# Patient Record
Sex: Male | Born: 1980 | Race: White | Hispanic: No | Marital: Married | Smoking: Never smoker
Health system: Southern US, Community
[De-identification: ages and names within clinical notes are randomized; demographics above are authoritative.]

## PROBLEM LIST (undated history)

## (undated) DIAGNOSIS — D696 Thrombocytopenia, unspecified: Secondary | ICD-10-CM

## (undated) DIAGNOSIS — S32040A Wedge compression fracture of fourth lumbar vertebra, initial encounter for closed fracture: Secondary | ICD-10-CM

## (undated) DIAGNOSIS — R739 Hyperglycemia, unspecified: Secondary | ICD-10-CM

## (undated) DIAGNOSIS — R55 Syncope and collapse: Secondary | ICD-10-CM

## (undated) DIAGNOSIS — S27321A Contusion of lung, unilateral, initial encounter: Secondary | ICD-10-CM

## (undated) DIAGNOSIS — S2231XA Fracture of one rib, right side, initial encounter for closed fracture: Secondary | ICD-10-CM

## (undated) DIAGNOSIS — I4891 Unspecified atrial fibrillation: Secondary | ICD-10-CM

## (undated) DIAGNOSIS — S62619A Displaced fracture of proximal phalanx of unspecified finger, initial encounter for closed fracture: Secondary | ICD-10-CM

## (undated) HISTORY — DX: Wedge compression fracture of fourth lumbar vertebra, initial encounter for closed fracture: S32.040A

## (undated) HISTORY — DX: Hyperglycemia, unspecified: R73.9

## (undated) HISTORY — DX: Thrombocytopenia, unspecified: D69.6

## (undated) HISTORY — DX: Fracture of one rib, right side, initial encounter for closed fracture: S22.31XA

## (undated) HISTORY — DX: Contusion of lung, unilateral, initial encounter: S27.321A

## (undated) HISTORY — DX: Displaced fracture of proximal phalanx of unspecified finger, initial encounter for closed fracture: S62.619A

## (undated) HISTORY — DX: Unspecified atrial fibrillation: I48.91

## (undated) HISTORY — DX: Syncope and collapse: R55

---

## 2020-02-13 ENCOUNTER — Inpatient Hospital Stay (HOSPITAL_COMMUNITY)
Admission: EM | Admit: 2020-02-13 | Discharge: 2020-02-15 | DRG: 206 | Disposition: A | Discharge status: Home Health | Payer: Managed Care, Other (non HMO) | Attending: General Surgery | Admitting: General Surgery

## 2020-02-13 ENCOUNTER — Other Ambulatory Visit: Payer: Self-pay

## 2020-02-13 ENCOUNTER — Emergency Department (HOSPITAL_COMMUNITY): Payer: Managed Care, Other (non HMO)

## 2020-02-13 ENCOUNTER — Inpatient Hospital Stay (HOSPITAL_COMMUNITY): Payer: Managed Care, Other (non HMO)

## 2020-02-13 ENCOUNTER — Encounter (HOSPITAL_COMMUNITY): Payer: Self-pay | Admitting: *Deleted

## 2020-02-13 DIAGNOSIS — S27329A Contusion of lung, unspecified, initial encounter: Secondary | ICD-10-CM

## 2020-02-13 DIAGNOSIS — S0083XA Contusion of other part of head, initial encounter: Secondary | ICD-10-CM | POA: Diagnosis present

## 2020-02-13 DIAGNOSIS — R55 Syncope and collapse: Secondary | ICD-10-CM | POA: Diagnosis not present

## 2020-02-13 DIAGNOSIS — I4891 Unspecified atrial fibrillation: Secondary | ICD-10-CM | POA: Diagnosis not present

## 2020-02-13 DIAGNOSIS — I480 Paroxysmal atrial fibrillation: Secondary | ICD-10-CM | POA: Diagnosis present

## 2020-02-13 DIAGNOSIS — M4856XA Collapsed vertebra, not elsewhere classified, lumbar region, initial encounter for fracture: Secondary | ICD-10-CM | POA: Diagnosis present

## 2020-02-13 DIAGNOSIS — D6959 Other secondary thrombocytopenia: Secondary | ICD-10-CM | POA: Diagnosis not present

## 2020-02-13 DIAGNOSIS — S2241XA Multiple fractures of ribs, right side, initial encounter for closed fracture: Secondary | ICD-10-CM | POA: Diagnosis present

## 2020-02-13 DIAGNOSIS — Z881 Allergy status to other antibiotic agents status: Secondary | ICD-10-CM

## 2020-02-13 DIAGNOSIS — Z91011 Allergy to milk products: Secondary | ICD-10-CM | POA: Diagnosis not present

## 2020-02-13 DIAGNOSIS — E785 Hyperlipidemia, unspecified: Secondary | ICD-10-CM | POA: Diagnosis present

## 2020-02-13 DIAGNOSIS — Z20822 Contact with and (suspected) exposure to covid-19: Secondary | ICD-10-CM | POA: Diagnosis present

## 2020-02-13 DIAGNOSIS — M79641 Pain in right hand: Secondary | ICD-10-CM

## 2020-02-13 DIAGNOSIS — S27321A Contusion of lung, unilateral, initial encounter: Principal | ICD-10-CM | POA: Diagnosis present

## 2020-02-13 DIAGNOSIS — S32049A Unspecified fracture of fourth lumbar vertebra, initial encounter for closed fracture: Secondary | ICD-10-CM

## 2020-02-13 DIAGNOSIS — R739 Hyperglycemia, unspecified: Secondary | ICD-10-CM

## 2020-02-13 DIAGNOSIS — R4020 Unspecified coma: Secondary | ICD-10-CM

## 2020-02-13 DIAGNOSIS — S2249XA Multiple fractures of ribs, unspecified side, initial encounter for closed fracture: Secondary | ICD-10-CM

## 2020-02-13 LAB — COMPREHENSIVE METABOLIC PANEL
ALT: 57 U/L — ABNORMAL HIGH (ref 0–44)
AST: 55 U/L — ABNORMAL HIGH (ref 15–41)
Albumin: 4.1 g/dL (ref 3.5–5.0)
Alkaline Phosphatase: 60 U/L (ref 38–126)
Anion gap: 12 (ref 5–15)
BUN: 16 mg/dL (ref 6–20)
CO2: 23 mmol/L (ref 22–32)
Calcium: 9.2 mg/dL (ref 8.9–10.3)
Chloride: 102 mmol/L (ref 98–111)
Creatinine, Ser: 1.13 mg/dL (ref 0.61–1.24)
GFR calc Af Amer: >60 mL/min (ref >60)
GFR calc non Af Amer: >60 mL/min (ref >60)
Glucose, Bld: 188 mg/dL — ABNORMAL HIGH (ref 70–99)
Potassium: 4.2 mmol/L (ref 3.5–5.1)
Sodium: 137 mmol/L (ref 135–145)
Total Bilirubin: 1.1 mg/dL (ref 0.3–1.2)
Total Protein: 6.8 g/dL (ref 6.5–8.1)

## 2020-02-13 LAB — CBC WITH DIFFERENTIAL/PLATELET
Abs Immature Granulocytes: 0.09 10*3/uL — ABNORMAL HIGH (ref 0.00–0.07)
Basophils Absolute: 0 10*3/uL (ref 0.0–0.1)
Basophils Relative: 1 %
Eosinophils Absolute: 0 10*3/uL (ref 0.0–0.5)
Eosinophils Relative: 1 %
HCT: 45.5 % (ref 39.0–52.0)
Hemoglobin: 15.3 g/dL (ref 13.0–17.0)
Immature Granulocytes: 1 %
Lymphocytes Relative: 13 %
Lymphs Abs: 1.1 10*3/uL (ref 0.7–4.0)
MCH: 29.3 pg (ref 26.0–34.0)
MCHC: 33.6 g/dL (ref 30.0–36.0)
MCV: 87 fL (ref 80.0–100.0)
Monocytes Absolute: 0.5 10*3/uL (ref 0.1–1.0)
Monocytes Relative: 5 %
Neutro Abs: 6.8 10*3/uL (ref 1.7–7.7)
Neutrophils Relative %: 79 %
Platelets: 188 10*3/uL (ref 150–400)
RBC: 5.23 MIL/uL (ref 4.22–5.81)
RDW: 12 % (ref 11.5–15.5)
WBC: 8.6 10*3/uL (ref 4.0–10.5)
nRBC: 0 % (ref 0.0–0.2)

## 2020-02-13 LAB — HEMOGLOBIN A1C
Hgb A1c MFr Bld: 5.4 % (ref 4.8–5.6)
Mean Plasma Glucose: 108.28 mg/dL

## 2020-02-13 LAB — TROPONIN I (HIGH SENSITIVITY)
Troponin I (High Sensitivity): 6 ng/L (ref <18)
Troponin I (High Sensitivity): 6 ng/L (ref <18)

## 2020-02-13 LAB — LACTIC ACID, PLASMA
Lactic Acid, Venous: 3.2 mmol/L (ref 0.5–1.9)
Lactic Acid, Venous: 1 mmol/L (ref 0.5–1.9)

## 2020-02-13 LAB — TSH: TSH: 0.924 u[IU]/mL (ref 0.350–4.500)

## 2020-02-13 LAB — HIV ANTIBODY (ROUTINE TESTING W REFLEX): HIV Screen 4th Generation wRfx: NONREACTIVE

## 2020-02-13 LAB — CBG MONITORING, ED: Glucose-Capillary: 187 mg/dL — ABNORMAL HIGH (ref 70–99)

## 2020-02-13 LAB — SARS CORONAVIRUS 2 BY RT PCR (HOSPITAL ORDER, PERFORMED IN ~~LOC~~ HOSPITAL LAB): SARS Coronavirus 2: NEGATIVE

## 2020-02-13 MED ORDER — SODIUM CHLORIDE 0.9 % IV BOLUS
1000.0000 mL | Freq: Once | INTRAVENOUS | Status: AC
  Administered 2020-02-13: 1000 mL via INTRAVENOUS

## 2020-02-13 MED ORDER — ONDANSETRON HCL 4 MG/2ML IJ SOLN: 4.0000 mg | Freq: Four times a day (QID) | INTRAMUSCULAR | Status: DC | PRN

## 2020-02-13 MED ORDER — IBUPROFEN 200 MG PO TABS
600.0000 mg | ORAL_TABLET | Freq: Four times a day (QID) | ORAL | Status: DC | PRN
  Administered 2020-02-14 – 2020-02-15 (×2): 600 mg via ORAL
  Filled 2020-02-13 (×2): qty 3

## 2020-02-13 MED ORDER — ACETAMINOPHEN 325 MG PO TABS
650.0000 mg | ORAL_TABLET | Freq: Four times a day (QID) | ORAL | Status: DC
  Administered 2020-02-13 – 2020-02-15 (×7): 650 mg via ORAL
  Filled 2020-02-13 (×7): qty 2

## 2020-02-13 MED ORDER — LACTATED RINGERS IV SOLN: INTRAVENOUS | Status: DC

## 2020-02-13 MED ORDER — ONDANSETRON 4 MG PO TBDP: 4.0000 mg | ORAL_TABLET | Freq: Four times a day (QID) | ORAL | Status: DC | PRN

## 2020-02-13 MED ORDER — IOHEXOL 300 MG/ML  SOLN
100.0000 mL | Freq: Once | INTRAMUSCULAR | Status: AC | PRN
  Administered 2020-02-13: 100 mL via INTRAVENOUS

## 2020-02-13 MED ORDER — HYDROMORPHONE HCL 1 MG/ML IJ SOLN: 0.5000 mg | INTRAMUSCULAR | Status: DC | PRN

## 2020-02-13 MED ORDER — ENOXAPARIN SODIUM 40 MG/0.4ML ~~LOC~~ SOLN
40.0000 mg | SUBCUTANEOUS | Status: DC
  Filled 2020-02-13 (×2): qty 0.4

## 2020-02-13 MED ORDER — OXYCODONE HCL 5 MG PO TABS
5.0000 mg | ORAL_TABLET | Freq: Four times a day (QID) | ORAL | Status: DC | PRN
  Administered 2020-02-14: 5 mg via ORAL
  Filled 2020-02-13: qty 2
  Filled 2020-02-13: qty 1

## 2020-02-13 MED ORDER — DOCUSATE SODIUM 100 MG PO CAPS
200.0000 mg | ORAL_CAPSULE | Freq: Two times a day (BID) | ORAL | Status: DC
  Administered 2020-02-13 – 2020-02-15 (×4): 200 mg via ORAL
  Filled 2020-02-13 (×5): qty 2

## 2020-02-13 MED ORDER — FENTANYL CITRATE (PF) 100 MCG/2ML IJ SOLN
50.0000 ug | Freq: Once | INTRAMUSCULAR | Status: AC
  Administered 2020-02-13: 50 ug via INTRAVENOUS
  Filled 2020-02-13: qty 2

## 2020-02-13 MED ORDER — DIPHENHYDRAMINE HCL 25 MG PO CAPS: 25.0000 mg | ORAL_CAPSULE | Freq: Four times a day (QID) | ORAL | Status: DC | PRN

## 2020-02-13 NOTE — Progress Notes (Signed)
Orthopedic Tech Progress Note Patient Details:  Barry Ware 10-18-1980 [id number]  Ortho Devices Type of Ortho Device: Finger splint Ortho Device/Splint Location: Right Index Finger Ortho Device/Splint Interventions: Application   Post Interventions Patient Tolerated: Well   Barry Ware [name] 02/13/2020, 10:11 PM

## 2020-02-13 NOTE — Progress Notes (Signed)
Orthopedic Tech Progress Note Patient Details:  Barry Ware Oct 10, 1980 [id number] Called in brace Patient ID: Barry Ware, male   DOB: [birth year], 39 y.o.   MRN: [id number]   [name] 02/13/2020, 8:29 PM
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
## 2020-02-13 NOTE — ED Notes (Signed)
Patient transported to X-ray 
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
## 2020-02-14 ENCOUNTER — Inpatient Hospital Stay (HOSPITAL_COMMUNITY): Payer: Managed Care, Other (non HMO)

## 2020-02-14 DIAGNOSIS — S2249XA Multiple fractures of ribs, unspecified side, initial encounter for closed fracture: Secondary | ICD-10-CM

## 2020-02-14 DIAGNOSIS — I4891 Unspecified atrial fibrillation: Secondary | ICD-10-CM

## 2020-02-14 DIAGNOSIS — R55 Syncope and collapse: Secondary | ICD-10-CM

## 2020-02-14 DIAGNOSIS — R739 Hyperglycemia, unspecified: Secondary | ICD-10-CM

## 2020-02-14 DIAGNOSIS — R4020 Unspecified coma: Secondary | ICD-10-CM

## 2020-02-14 LAB — GLUCOSE, CAPILLARY
Glucose-Capillary: 157 mg/dL — ABNORMAL HIGH (ref 70–99)
Glucose-Capillary: 110 mg/dL — ABNORMAL HIGH (ref 70–99)
Glucose-Capillary: 110 mg/dL — ABNORMAL HIGH (ref 70–99)
Glucose-Capillary: 101 mg/dL — ABNORMAL HIGH (ref 70–99)

## 2020-02-14 LAB — RAPID URINE DRUG SCREEN, HOSP PERFORMED
Amphetamines: NOT DETECTED
Barbiturates: NOT DETECTED
Benzodiazepines: NOT DETECTED
Cocaine: NOT DETECTED
Opiates: NOT DETECTED
Tetrahydrocannabinol: NOT DETECTED

## 2020-02-14 LAB — COMPREHENSIVE METABOLIC PANEL
ALT: 44 U/L (ref 0–44)
AST: 36 U/L (ref 15–41)
Albumin: 3.5 g/dL (ref 3.5–5.0)
Alkaline Phosphatase: 48 U/L (ref 38–126)
Anion gap: 10 (ref 5–15)
BUN: 13 mg/dL (ref 6–20)
CO2: 22 mmol/L (ref 22–32)
Calcium: 8.7 mg/dL — ABNORMAL LOW (ref 8.9–10.3)
Chloride: 107 mmol/L (ref 98–111)
Creatinine, Ser: 0.87 mg/dL (ref 0.61–1.24)
GFR calc Af Amer: >60 mL/min (ref >60)
GFR calc non Af Amer: >60 mL/min (ref >60)
Glucose, Bld: 109 mg/dL — ABNORMAL HIGH (ref 70–99)
Potassium: 3.8 mmol/L (ref 3.5–5.1)
Sodium: 139 mmol/L (ref 135–145)
Total Bilirubin: 1.2 mg/dL (ref 0.3–1.2)
Total Protein: 6.1 g/dL — ABNORMAL LOW (ref 6.5–8.1)

## 2020-02-14 LAB — CBC
HCT: 40.7 % (ref 39.0–52.0)
Hemoglobin: 13.3 g/dL (ref 13.0–17.0)
MCH: 28.8 pg (ref 26.0–34.0)
MCHC: 32.7 g/dL (ref 30.0–36.0)
MCV: 88.1 fL (ref 80.0–100.0)
Platelets: 142 10*3/uL — ABNORMAL LOW (ref 150–400)
RBC: 4.62 MIL/uL (ref 4.22–5.81)
RDW: 12.3 % (ref 11.5–15.5)
WBC: 6.1 10*3/uL (ref 4.0–10.5)
nRBC: 0 % (ref 0.0–0.2)

## 2020-02-14 LAB — URINALYSIS, ROUTINE W REFLEX MICROSCOPIC
Bilirubin Urine: NEGATIVE
Glucose, UA: 50 mg/dL — AB
Hgb urine dipstick: NEGATIVE
Ketones, ur: 5 mg/dL — AB
Leukocytes,Ua: NEGATIVE
Nitrite: NEGATIVE
Protein, ur: NEGATIVE mg/dL
Specific Gravity, Urine: 1.036 — ABNORMAL HIGH (ref 1.005–1.030)
pH: 7 (ref 5.0–8.0)

## 2020-02-14 LAB — ECHOCARDIOGRAM COMPLETE
Area-P 1/2: 2.76 cm2
Height: 73 in
S' Lateral: 3.29 cm
Weight: 2640 oz

## 2020-02-14 LAB — MRSA PCR SCREENING: MRSA by PCR: NEGATIVE

## 2020-02-14 MED ORDER — POLYETHYLENE GLYCOL 3350 17 G PO PACK: 17.0000 g | PACK | Freq: Every day | ORAL | Status: DC | PRN

## 2020-02-14 MED ORDER — METHOCARBAMOL 500 MG PO TABS
500.0000 mg | ORAL_TABLET | Freq: Three times a day (TID) | ORAL | Status: DC
  Administered 2020-02-14 – 2020-02-15 (×4): 500 mg via ORAL
  Filled 2020-02-14 (×4): qty 1

## 2020-02-14 MED ORDER — INSULIN ASPART 100 UNIT/ML ~~LOC~~ SOLN: 0.0000 [IU] | Freq: Every day | SUBCUTANEOUS | Status: DC

## 2020-02-14 MED ORDER — INSULIN ASPART 100 UNIT/ML ~~LOC~~ SOLN: 0.0000 [IU] | Freq: Three times a day (TID) | SUBCUTANEOUS | Status: DC

## 2020-02-14 NOTE — Progress Notes (Signed)
Orthopedic Tech Progress Note Patient Details:  Onyx Schirmer [birth year] [id number]  Ortho Devices Type of Ortho Device: Volar splint Ortho Device/Splint Location: URE Ortho Device/Splint Interventions: Application, Ordered   Post Interventions Patient Tolerated: Well Instructions Provided: Care of device   Tashiana Lamarca A Tyra Gural 02/14/2020, 1:05 PM

## 2020-02-14 NOTE — ED Provider Notes (Signed)
Ultrasound ED FAST  Date/Time: 02/14/2020 12:17 AM Performed by: Milagros Loll, MD Authorized by: Milagros Loll, MD  Procedure details:    Indications: blunt abdominal trauma and blunt chest trauma      Assess for:  Hemothorax, intra-abdominal fluid, pericardial effusion and pneumothorax    Technique:  Abdominal, cardiac and chest    Images: archived      Abdominal findings:    L kidney:  Visualized   R kidney:  Visualized   Liver:  Visualized    Bladder:  Visualized, Foley catheter not visualized   Hepatorenal space visualized: identified     Splenorenal space: identified     Rectovesical free fluid: not identified     Splenorenal free fluid: not identified     Hepatorenal space free fluid: not identified   Cardiac findings:    Heart:  Visualized   Wall motion: identified     Pericardial effusion: not identified   Chest findings:    L lung sliding: identified     R lung sliding: identified     Fluid in thorax: not identified        Milagros Loll, MD 02/14/20 [phone]
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
## 2020-02-14 NOTE — Progress Notes (Signed)
  Echocardiogram 2D Echocardiogram has been performed.  Barry Ware 02/14/2020, 11:10 AM
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
## 2020-02-14 NOTE — Progress Notes (Signed)
OT Cancellation Note  Patient Details Name: Barry Ware MRN: [id number] DOB: 05/27/1981   Cancelled Treatment:    Reason Eval/Treat Not Completed: Patient at procedure or test/ unavailable (Echo). OT acknowledges imminent discharge order, and will check back as available.   Evern Bio Tekela Garguilo 02/14/2020, 10:46 AM   Nyoka Cowden OTR/L Acute Rehabilitation Services Pager: [phone] Office: [phone]
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
## 2020-02-15 ENCOUNTER — Other Ambulatory Visit: Payer: Self-pay | Admitting: Medical

## 2020-02-15 DIAGNOSIS — R55 Syncope and collapse: Secondary | ICD-10-CM

## 2020-02-15 LAB — GLUCOSE, CAPILLARY
Glucose-Capillary: 118 mg/dL — ABNORMAL HIGH (ref 70–99)
Glucose-Capillary: 171 mg/dL — ABNORMAL HIGH (ref 70–99)
Glucose-Capillary: 101 mg/dL — ABNORMAL HIGH (ref 70–99)

## 2020-02-15 MED ORDER — DOCUSATE SODIUM 100 MG PO CAPS: 200.0000 mg | ORAL_CAPSULE | Freq: Two times a day (BID) | ORAL | 0 refills | Status: DC

## 2020-02-15 MED ORDER — METHOCARBAMOL 500 MG PO TABS
500.0000 mg | ORAL_TABLET | Freq: Three times a day (TID) | ORAL | 0 refills | Status: DC
Start: 2020-02-15 — End: 2020-05-22

## 2020-02-15 MED ORDER — IBUPROFEN 600 MG PO TABS
600.0000 mg | ORAL_TABLET | Freq: Four times a day (QID) | ORAL | 0 refills | Status: DC | PRN
Start: 2020-02-15 — End: 2020-05-22

## 2020-02-15 MED ORDER — ACETAMINOPHEN 325 MG PO TABS
650.0000 mg | ORAL_TABLET | Freq: Four times a day (QID) | ORAL | 0 refills | Status: DC
Start: 2020-02-15 — End: 2020-05-22

## 2020-02-15 MED ORDER — OXYCODONE HCL 5 MG PO TABS: 5.0000 mg | ORAL_TABLET | Freq: Four times a day (QID) | ORAL | 0 refills | Status: DC | PRN
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
## 2020-02-15 NOTE — Progress Notes (Signed)
Pt discharged home with 3n1 and outpatient therapy consults. PIV removed, belongings packed, and discharge paperwork educated on with no questions asked.  Robina Ade, RN
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
## 2020-02-15 NOTE — TOC CAGE-AID Note (Signed)
Transition of Care [facility]) - CAGE-AID Screening   Patient Details  Name: Barry Ware MRN: [id number] Date of Birth: June 28, 1980  [facility]) CM/SW Contact:    Emeterio Reeve, [place] Phone Number: 02/15/2020, 4:00 PM   Clinical Narrative:  CSW met with pt at bedside. CSW introduced self and explained her role at the hospital.  PT denies alcohol use and substance use. Pt did not need any resources at this time.    CAGE-AID Screening:    Have You Ever Felt You Ought to Cut Down on Your Drinking or Drug Use?: No Have People Annoyed You By Critizing Your Drinking Or Drug Use?: No Have You Felt Bad Or Guilty About Your Drinking Or Drug Use?: No Have You Ever Had a Drink or Used Drugs First Thing In The Morning to Steady Your Nerves or to Get Rid of a Hangover?: No CAGE-AID Score: 0  Substance Abuse Education Offered: Yes    Blima Ledger, [place] Social Worker [phone]
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
## 2020-02-26 ENCOUNTER — Other Ambulatory Visit: Payer: Self-pay

## 2020-02-26 ENCOUNTER — Encounter: Payer: Self-pay | Admitting: Cardiology

## 2020-02-26 ENCOUNTER — Ambulatory Visit (INDEPENDENT_AMBULATORY_CARE_PROVIDER_SITE_OTHER): Payer: Managed Care, Other (non HMO) | Admitting: Cardiology

## 2020-02-26 VITALS — BP 120/76 | HR 82 | Ht 73.0 in | Wt 173.8 lb

## 2020-02-26 DIAGNOSIS — I4891 Unspecified atrial fibrillation: Secondary | ICD-10-CM

## 2020-02-26 DIAGNOSIS — R55 Syncope and collapse: Secondary | ICD-10-CM | POA: Diagnosis not present

## 2020-02-26 NOTE — Patient Instructions (Addendum)
Medication Instructions:  Your physician recommends that you continue on your current medications as directed. Please refer to the Current Medication list given to you today.  Labwork: None ordered.  Testing/Procedures: None ordered.  Follow-Up: Your physician wants you to follow-up in: 3 months with Dr. Lalla Brothers.     May 29, 2020 at 9:30 am at the [facility] office   Any Other Special Instructions Will Be Listed Below (If Applicable).  If you need a refill on your cardiac medications before your next appointment, please call your pharmacy.

## 2020-02-28 ENCOUNTER — Ambulatory Visit (INDEPENDENT_AMBULATORY_CARE_PROVIDER_SITE_OTHER): Payer: Managed Care, Other (non HMO)

## 2020-02-28 DIAGNOSIS — R55 Syncope and collapse: Secondary | ICD-10-CM | POA: Diagnosis not present

## 2020-03-03 ENCOUNTER — Ambulatory Visit: Payer: Managed Care, Other (non HMO)

## 2020-03-04 ENCOUNTER — Telehealth: Payer: Self-pay | Admitting: Cardiology

## 2020-03-04 NOTE — Telephone Encounter (Signed)
New message:    Patient calling to see if it is ok for him to drive. Please call patient.

## 2020-03-04 NOTE — Telephone Encounter (Signed)
Sent mychart message advising Pt could drive.

## 2020-03-11 ENCOUNTER — Other Ambulatory Visit: Payer: Self-pay

## 2020-03-11 ENCOUNTER — Ambulatory Visit: Payer: Managed Care, Other (non HMO) | Attending: General Surgery

## 2020-03-11 DIAGNOSIS — M542 Cervicalgia: Secondary | ICD-10-CM | POA: Diagnosis not present

## 2020-03-11 DIAGNOSIS — M545 Low back pain, unspecified: Secondary | ICD-10-CM

## 2020-03-11 DIAGNOSIS — M6281 Muscle weakness (generalized): Secondary | ICD-10-CM | POA: Diagnosis not present

## 2020-03-11 DIAGNOSIS — S2241XD Multiple fractures of ribs, right side, subsequent encounter for fracture with routine healing: Secondary | ICD-10-CM | POA: Insufficient documentation

## 2020-03-11 DIAGNOSIS — M25641 Stiffness of right hand, not elsewhere classified: Secondary | ICD-10-CM | POA: Diagnosis not present

## 2020-03-11 DIAGNOSIS — R0781 Pleurodynia: Secondary | ICD-10-CM | POA: Insufficient documentation

## 2020-03-11 DIAGNOSIS — R278 Other lack of coordination: Secondary | ICD-10-CM | POA: Diagnosis not present

## 2020-03-11 DIAGNOSIS — M79641 Pain in right hand: Secondary | ICD-10-CM | POA: Diagnosis not present

## 2020-03-11 NOTE — Addendum Note (Signed)
Addended by: Ileana Ladd on: 03/11/2020 01:12 PM   Modules accepted: Orders

## 2020-03-11 NOTE — Addendum Note (Signed)
Addended by: Ileana Ladd on: 03/11/2020 01:04 PM   Modules accepted: Orders
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
## 2020-03-20 ENCOUNTER — Ambulatory Visit: Payer: Managed Care, Other (non HMO) | Admitting: Occupational Therapy

## 2020-03-20 ENCOUNTER — Encounter: Payer: Self-pay | Admitting: Occupational Therapy

## 2020-03-20 ENCOUNTER — Other Ambulatory Visit: Payer: Self-pay

## 2020-03-20 DIAGNOSIS — M79641 Pain in right hand: Secondary | ICD-10-CM

## 2020-03-20 DIAGNOSIS — R278 Other lack of coordination: Secondary | ICD-10-CM

## 2020-03-20 DIAGNOSIS — M25641 Stiffness of right hand, not elsewhere classified: Secondary | ICD-10-CM

## 2020-03-20 DIAGNOSIS — M6281 Muscle weakness (generalized): Secondary | ICD-10-CM

## 2020-03-20 DIAGNOSIS — S2241XD Multiple fractures of ribs, right side, subsequent encounter for fracture with routine healing: Secondary | ICD-10-CM | POA: Diagnosis not present
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
## 2020-03-20 NOTE — Patient Instructions (Signed)
1. Grip Strengthening (Resistive Putty)   Squeeze putty using thumb and all fingers. Repeat _20___ times. Do __2__ sessions per day.   2. Roll putty into tube on table and pinch between each finger and thumb x 10 reps each. (can do ring and small finger together)     Copyright  VHI. All rights reserved.    Coordination Activities  Perform the following activities for 20 minutes 1 times per day with right hand(s).   Rotate ball in fingertips (clockwise and counter-clockwise).  Flip cards 1 at a time as fast as you can.  Deal cards with your thumb (Hold deck in hand and push card off top with thumb).  Shuffle cards.  Pick up coins and place in container or coin bank.  Pick up coins and stack.  Pick up coins one at a time until you get 5-10 in your hand, then move coins from palm to fingertips to stack one at a time.

## 2020-03-26 ENCOUNTER — Other Ambulatory Visit: Payer: Self-pay

## 2020-03-26 ENCOUNTER — Ambulatory Visit: Payer: Managed Care, Other (non HMO) | Attending: General Surgery | Admitting: Physical Therapy

## 2020-03-26 DIAGNOSIS — M542 Cervicalgia: Secondary | ICD-10-CM

## 2020-03-26 DIAGNOSIS — S2241XA Multiple fractures of ribs, right side, initial encounter for closed fracture: Secondary | ICD-10-CM | POA: Diagnosis present

## 2020-03-26 DIAGNOSIS — X58XXXA Exposure to other specified factors, initial encounter: Secondary | ICD-10-CM | POA: Insufficient documentation

## 2020-03-26 DIAGNOSIS — R0781 Pleurodynia: Secondary | ICD-10-CM

## 2020-03-26 DIAGNOSIS — M545 Low back pain, unspecified: Secondary | ICD-10-CM
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
## 2020-03-26 NOTE — Patient Instructions (Addendum)
  Home exercise program created by Donna Silverman, PT.  For questions, please contact Tyleah Loh via phone at [phone] or email at [email or web address]  [facility] [place] [address]  [address] [place], [place], [address] Phone: [phone]   Fax:  [phone]     Sleeping on Back  Place pillow under knees. A pillow with cervical support and a roll around waist are also helpful. Copyright  VHI. All rights reserved.  Sleeping on Side Place pillow between knees. Use cervical support under neck and a roll around waist as needed. Copyright  VHI. All rights reserved.   Sleeping on Stomach   If this is the only desirable sleeping position, place pillow under lower legs, and under stomach or chest as needed.  Posture - Sitting   Sit upright, head facing forward. Try using a roll to support lower back. Keep shoulders relaxed, and avoid rounded back. Keep hips level with knees. Avoid crossing legs for long periods. Stand to Sit / Sit to Stand   To sit: Bend knees to lower self onto front edge of chair, then scoot back on seat. To stand: Reverse sequence by placing one foot forward, and scoot to front of seat. Use rocking motion to stand up.   Work Height and Reach  Ideal work height is no more than 2 to 4 inches below elbow level when standing, and at elbow level when sitting. Reaching should be limited to arm's length, with elbows slightly bent.  Bending  Bend at hips and knees, not back. Keep feet shoulder-width apart.    Posture - Standing   Good posture is important. Avoid slouching and forward head thrust. Maintain curve in low back and align ears over shoul- ders, hips over ankles.  Alternating Positions   Alternate tasks and change positions frequently to reduce fatigue and muscle tension. Take rest breaks. Computer Work   Position work to face forward. Use proper work and seat height. Keep shoulders back  and down, wrists straight, and elbows at right angles. Use chair that provides full back support. Add footrest and lumbar roll as needed.  Getting Into / Out of Car  Lower self onto seat, scoot back, then bring in one leg at a time. Reverse sequence to get out.  Dressing  Lie on back to pull socks or slacks over feet, or sit and bend leg while keeping back straight.    Housework - Sink  Place one foot on ledge of cabinet under sink when standing at sink for prolonged periods.   Pushing / Pulling  Pushing is preferable to pulling. Keep back in proper alignment, and use leg muscles to do the work.  Deep Squat   Squat and lift with both arms held against upper trunk. Tighten stomach muscles without holding breath. Use smooth movements to avoid jerking.  Avoid Twisting   Avoid twisting or bending back. Pivot around using foot movements, and bend at knees if needed when reaching for articles.  Carrying Luggage   Distribute weight evenly on both sides. Use a cart whenever possible. Do not twist trunk. Move body as a unit.   Lifting Principles .Maintain proper posture and head alignment. .Slide object as close as possible before lifting. .Move obstacles out of the way. .Test before lifting; ask for help if too heavy. .Tighten stomach muscles without holding breath. .Use smooth movements; do not jerk. .Use legs to do the work, and pivot with feet. .Distribute the work load symmetrically and   close to the center of trunk. .Push instead of pull whenever possible.   Ask For Help   Ask for help and delegate to others when possible. Coordinate your movements when lifting together, and maintain the low back curve.  Log Roll   Lying on back, bend left knee and place left arm across chest. Roll all in one movement to the right. Reverse to roll to the left. Always move as one unit. Housework - Sweeping  Use long-handled equipment to avoid stooping.   Housework -  Wiping  Position yourself as close as possible to reach work surface. Avoid straining your back.  Laundry - Unloading Wash   To unload small items at bottom of washer, lift leg opposite to arm being used to reach.  Gardening - Raking  Move close to area to be raked. Use arm movements to do the work. Keep back straight and avoid twisting.     Cart  When reaching into cart with one arm, lift opposite leg to keep back straight.   Getting Into / Out of Bed  Lower self to lie down on one side by raising legs and lowering head at the same time. Use arms to assist moving without twisting. Bend both knees to roll onto back if desired. To sit up, start from lying on side, and use same move-ments in reverse. Housework - Vacuuming  Hold the vacuum with arm held at side. Step back and forth to move it, keeping head up. Avoid twisting.   Laundry - Loading Wash  Position laundry basket so that bending and twisting can be avoided.   Laundry - Unloading Dryer  Squat down to reach into clothes dryer or use a reacher.  Gardening - Weeding / Planting  Squat or Kneel. Knee pads may be helpful.                    

## 2020-03-28 ENCOUNTER — Ambulatory Visit: Payer: Managed Care, Other (non HMO) | Admitting: Occupational Therapy

## 2020-03-28 ENCOUNTER — Encounter: Payer: Self-pay | Admitting: Occupational Therapy

## 2020-03-28 ENCOUNTER — Other Ambulatory Visit: Payer: Self-pay

## 2020-03-28 DIAGNOSIS — S2241XA Multiple fractures of ribs, right side, initial encounter for closed fracture: Secondary | ICD-10-CM | POA: Diagnosis not present

## 2020-03-28 DIAGNOSIS — M6281 Muscle weakness (generalized): Secondary | ICD-10-CM

## 2020-03-28 DIAGNOSIS — M79641 Pain in right hand: Secondary | ICD-10-CM

## 2020-03-28 DIAGNOSIS — R278 Other lack of coordination: Secondary | ICD-10-CM

## 2020-03-28 DIAGNOSIS — M25641 Stiffness of right hand, not elsewhere classified: Secondary | ICD-10-CM
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
## 2020-03-28 NOTE — Patient Instructions (Addendum)
MP Flexion (Active Isolated)   Bend __index_ finger at large knuckle, keeping other fingers straight. Do not bend tips. Repeat _10-15___ times. Do __4-6__ sessions per day.  AROM: PIP Flexion / Extension   Pinch bottom knuckle of __index__ finger of hand to prevent bending. Actively bend middle knuckle until stretch is felt. Hold __5__ seconds. Relax. Straighten finger as far as possible. Repeat __10-15__ times per set. Do _4-6___ sessions per day.   AROM: DIP Flexion / Extension   Pinch middle knuckle of _index__ finger of  hand to prevent bending. Bend end knuckle until stretch is felt. Hold _5___ seconds. Relax. Straighten finger as far as possible. Repeat _10-15___ times per set.  Do _4-6___ sessions per day.  AROM: Finger Flexion / Extension   Actively bend fingers of  hand. Start with knuckles furthest from palm, and slowly make a fist. Hold __5__ seconds. Relax. Then straighten fingers as far as possible. Repeat _10-15___ times per set.  Do _4-6___ sessions per day.  Copyright  VHI. All rights reserved.      Flexor Tendon Gliding (Active Hook Fist)   With fingers and knuckles straight, bend middle and tip joints. Do not bend large knuckles. Repeat _10-15___ times. Do _4-6___ sessions per day.  MP Flexion (Active)   With back of hand on table, bend large knuckles as far as they will go, keeping small joints straight. Repeat _10-15___ times. Do __4-6__ sessions per day. Activity: Reach into a narrow container.*      Finger Flexion / Extension   With palm up, bend fingers of left hand toward palm, making a  fist. Straighten fingers, opening fist. Repeat sequence _10-15___ times per session. Do _4-6__ sessions per day. Hand Variation: Palm down   Copyright  VHI. All rights reserved.

## 2020-04-02 ENCOUNTER — Other Ambulatory Visit: Payer: Self-pay

## 2020-04-02 ENCOUNTER — Ambulatory Visit: Payer: Managed Care, Other (non HMO)

## 2020-04-02 DIAGNOSIS — M542 Cervicalgia: Secondary | ICD-10-CM

## 2020-04-02 DIAGNOSIS — S2241XA Multiple fractures of ribs, right side, initial encounter for closed fracture: Secondary | ICD-10-CM | POA: Diagnosis not present

## 2020-04-02 DIAGNOSIS — M545 Low back pain, unspecified: Secondary | ICD-10-CM

## 2020-04-02 DIAGNOSIS — R278 Other lack of coordination: Secondary | ICD-10-CM

## 2020-04-02 DIAGNOSIS — R0781 Pleurodynia: Secondary | ICD-10-CM

## 2020-04-02 DIAGNOSIS — M79641 Pain in right hand: Secondary | ICD-10-CM

## 2020-04-02 DIAGNOSIS — M25641 Stiffness of right hand, not elsewhere classified: Secondary | ICD-10-CM

## 2020-04-02 DIAGNOSIS — M6281 Muscle weakness (generalized): Secondary | ICD-10-CM
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
## 2020-04-04 ENCOUNTER — Encounter: Payer: Managed Care, Other (non HMO) | Admitting: Physical Therapy

## 2020-04-08 ENCOUNTER — Encounter: Payer: Self-pay | Admitting: Occupational Therapy

## 2020-04-08 ENCOUNTER — Other Ambulatory Visit: Payer: Self-pay

## 2020-04-08 ENCOUNTER — Ambulatory Visit: Payer: Managed Care, Other (non HMO) | Admitting: Occupational Therapy

## 2020-04-08 DIAGNOSIS — M25641 Stiffness of right hand, not elsewhere classified: Secondary | ICD-10-CM

## 2020-04-08 DIAGNOSIS — M79641 Pain in right hand: Secondary | ICD-10-CM

## 2020-04-08 DIAGNOSIS — S2241XA Multiple fractures of ribs, right side, initial encounter for closed fracture: Secondary | ICD-10-CM | POA: Diagnosis not present

## 2020-04-08 DIAGNOSIS — R278 Other lack of coordination: Secondary | ICD-10-CM

## 2020-04-08 NOTE — Patient Instructions (Addendum)
Dr. Merlyn Lot,   I am seeing Barry Ware for OT at [facility] outpatient. He is still experiencing quite a bit of pain. We have placed the putty exercises on hold at this time d/t pain and over utilization of the putty and would like you to advise on any precautions.  Currently, these are our measurements with his RUE index finger:  MCP 85* PIP 90* DIP 70*  Grip Strength RUE at 97.4 lbs    Are we ok to continue with strengthening?  Are there any other precautions we should be continuing?  Should he continue with buddy strapping or other orthoses?  Thank you, Kallie Edward, MOT, OTR/L   MP Flexion (Active Isolated)   Bend _index_ finger at large knuckle, keeping other fingers straight. Do not bend tips. Repeat _10-15___ times. Do __4-6__ sessions per day.  AROM: PIP Flexion / Extension   Pinch bottom knuckle of __index__ finger of hand to prevent bending. Actively bend middle knuckle until stretch is felt. Hold __5__ seconds. Relax. Straighten finger as far as possible. Repeat __10-15__ times per set. Do _4-6___ sessions per day.   AROM: DIP Flexion / Extension   Pinch middle knuckle of  index___ finger of  hand to prevent bending. Bend end knuckle until stretch is felt. Hold _5___ seconds. Relax. Straighten finger as far as possible. Repeat _10-15___ times per set.  Do _4-6___ sessions per day.  AROM: Finger Flexion / Extension   Actively bend fingers of  hand. Start with knuckles furthest from palm, and slowly make a fist. Hold __5__ seconds. Relax. Then straighten fingers as far as possible. Repeat _10-15___ times per set.  Do _4-6___ sessions per day.  Copyright  VHI. All rights reserved.      Flexor Tendon Gliding (Active Hook Fist)   With fingers and knuckles straight, bend middle and tip joints. Do not bend large knuckles. Repeat _10-15___ times. Do _4-6___ sessions per day.  MP Flexion (Active)   With back of hand on table, bend large knuckles as  far as they will go, keeping small joints straight. Repeat _10-15___ times. Do __4-6__ sessions per day. Activity: Reach into a narrow container.*      Finger Flexion / Extension   With palm up, bend fingers of left hand toward palm, making a  fist. Straighten fingers, opening fist. Repeat sequence _10-15___ times per session. Do _4-6__ sessions per day. Hand Variation: Palm down   Copyright  VHI. All rights reserved.  Finger Abduction / Adduction    Spread fingers of left hand. Then bring together. Keep fingers straight. Repeat sequence _10_ times per session. Do _14_ sessions per week. Hand Variation: Palm up   Copyright  VHI. All rights reserved.
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
## 2020-04-09 ENCOUNTER — Ambulatory Visit: Payer: Managed Care, Other (non HMO)

## 2020-04-09 DIAGNOSIS — M25641 Stiffness of right hand, not elsewhere classified: Secondary | ICD-10-CM

## 2020-04-09 DIAGNOSIS — M79641 Pain in right hand: Secondary | ICD-10-CM

## 2020-04-09 DIAGNOSIS — M542 Cervicalgia: Secondary | ICD-10-CM

## 2020-04-09 DIAGNOSIS — R0781 Pleurodynia: Secondary | ICD-10-CM

## 2020-04-09 DIAGNOSIS — S2241XA Multiple fractures of ribs, right side, initial encounter for closed fracture: Secondary | ICD-10-CM | POA: Diagnosis not present

## 2020-04-09 DIAGNOSIS — M545 Low back pain, unspecified: Secondary | ICD-10-CM

## 2020-04-09 DIAGNOSIS — R278 Other lack of coordination: Secondary | ICD-10-CM

## 2020-04-09 DIAGNOSIS — M6281 Muscle weakness (generalized): Secondary | ICD-10-CM
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
## 2020-04-11 ENCOUNTER — Encounter: Payer: Self-pay | Admitting: Physical Therapy

## 2020-04-11 ENCOUNTER — Ambulatory Visit: Payer: Managed Care, Other (non HMO) | Admitting: Physical Therapy

## 2020-04-11 ENCOUNTER — Other Ambulatory Visit: Payer: Self-pay

## 2020-04-11 DIAGNOSIS — M542 Cervicalgia: Secondary | ICD-10-CM

## 2020-04-11 DIAGNOSIS — M545 Low back pain, unspecified: Secondary | ICD-10-CM

## 2020-04-11 DIAGNOSIS — R0781 Pleurodynia: Secondary | ICD-10-CM

## 2020-04-11 DIAGNOSIS — S2241XA Multiple fractures of ribs, right side, initial encounter for closed fracture: Secondary | ICD-10-CM | POA: Diagnosis not present
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
## 2020-04-11 NOTE — Patient Instructions (Signed)
    Home exercise program created by Sabella Traore, PT.  For questions, please contact Jesus Nevills via phone at [phone] or email at [email or web address]  [facility] [address]  [address] [place], [place], [address] Phone: [phone]   Fax:  [phone]    

## 2020-04-15 ENCOUNTER — Encounter: Payer: Self-pay | Admitting: Physical Therapy

## 2020-04-15 ENCOUNTER — Ambulatory Visit: Payer: Managed Care, Other (non HMO) | Admitting: Physical Therapy

## 2020-04-15 ENCOUNTER — Other Ambulatory Visit: Payer: Self-pay

## 2020-04-15 DIAGNOSIS — R0781 Pleurodynia: Secondary | ICD-10-CM

## 2020-04-15 DIAGNOSIS — M545 Low back pain, unspecified: Secondary | ICD-10-CM

## 2020-04-15 DIAGNOSIS — S2241XA Multiple fractures of ribs, right side, initial encounter for closed fracture: Secondary | ICD-10-CM | POA: Diagnosis not present

## 2020-04-15 DIAGNOSIS — M542 Cervicalgia: Secondary | ICD-10-CM
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
## 2020-04-17 ENCOUNTER — Other Ambulatory Visit: Payer: Self-pay

## 2020-04-17 ENCOUNTER — Ambulatory Visit: Payer: Managed Care, Other (non HMO)

## 2020-04-17 DIAGNOSIS — M542 Cervicalgia: Secondary | ICD-10-CM

## 2020-04-17 DIAGNOSIS — S2241XA Multiple fractures of ribs, right side, initial encounter for closed fracture: Secondary | ICD-10-CM | POA: Diagnosis not present

## 2020-04-17 DIAGNOSIS — R0789 Other chest pain: Secondary | ICD-10-CM

## 2020-04-17 DIAGNOSIS — M545 Low back pain, unspecified: Secondary | ICD-10-CM

## 2020-04-17 DIAGNOSIS — R0781 Pleurodynia: Secondary | ICD-10-CM
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
## 2020-04-18 ENCOUNTER — Ambulatory Visit: Payer: Managed Care, Other (non HMO) | Admitting: Occupational Therapy

## 2020-04-18 ENCOUNTER — Encounter: Payer: Self-pay | Admitting: Occupational Therapy

## 2020-04-18 DIAGNOSIS — S2241XA Multiple fractures of ribs, right side, initial encounter for closed fracture: Secondary | ICD-10-CM | POA: Diagnosis not present

## 2020-04-18 DIAGNOSIS — M25641 Stiffness of right hand, not elsewhere classified: Secondary | ICD-10-CM

## 2020-04-18 DIAGNOSIS — M79641 Pain in right hand: Secondary | ICD-10-CM

## 2020-04-18 DIAGNOSIS — R278 Other lack of coordination: Secondary | ICD-10-CM

## 2020-04-18 DIAGNOSIS — M6281 Muscle weakness (generalized): Secondary | ICD-10-CM
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
## 2020-04-22 ENCOUNTER — Encounter: Payer: Self-pay | Admitting: Physical Therapy

## 2020-04-22 ENCOUNTER — Ambulatory Visit: Payer: Managed Care, Other (non HMO) | Attending: General Surgery | Admitting: Physical Therapy

## 2020-04-22 ENCOUNTER — Other Ambulatory Visit: Payer: Self-pay

## 2020-04-22 DIAGNOSIS — M542 Cervicalgia: Secondary | ICD-10-CM | POA: Diagnosis not present

## 2020-04-22 DIAGNOSIS — R0789 Other chest pain: Secondary | ICD-10-CM

## 2020-04-22 DIAGNOSIS — R0781 Pleurodynia: Secondary | ICD-10-CM | POA: Insufficient documentation

## 2020-04-22 DIAGNOSIS — M545 Low back pain, unspecified: Secondary | ICD-10-CM | POA: Diagnosis present
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
## 2020-04-23 ENCOUNTER — Encounter: Payer: Self-pay | Admitting: Physical Therapy

## 2020-04-29 ENCOUNTER — Other Ambulatory Visit: Payer: Self-pay

## 2020-04-29 ENCOUNTER — Ambulatory Visit: Payer: Managed Care, Other (non HMO)

## 2020-04-29 DIAGNOSIS — M542 Cervicalgia: Secondary | ICD-10-CM | POA: Diagnosis not present

## 2020-04-29 DIAGNOSIS — M545 Low back pain, unspecified: Secondary | ICD-10-CM

## 2020-04-29 DIAGNOSIS — R0781 Pleurodynia: Secondary | ICD-10-CM
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
## 2020-05-06 ENCOUNTER — Encounter: Payer: Self-pay | Admitting: Physical Therapy

## 2020-05-06 ENCOUNTER — Other Ambulatory Visit: Payer: Self-pay

## 2020-05-06 ENCOUNTER — Ambulatory Visit: Payer: Managed Care, Other (non HMO) | Admitting: Physical Therapy

## 2020-05-06 DIAGNOSIS — R0781 Pleurodynia: Secondary | ICD-10-CM

## 2020-05-06 DIAGNOSIS — M542 Cervicalgia: Secondary | ICD-10-CM | POA: Diagnosis not present

## 2020-05-06 DIAGNOSIS — M545 Low back pain, unspecified: Secondary | ICD-10-CM
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
## 2020-05-22 ENCOUNTER — Other Ambulatory Visit: Payer: Self-pay

## 2020-05-22 ENCOUNTER — Encounter: Payer: Self-pay | Admitting: Cardiology

## 2020-05-22 ENCOUNTER — Ambulatory Visit (INDEPENDENT_AMBULATORY_CARE_PROVIDER_SITE_OTHER): Payer: Managed Care, Other (non HMO) | Admitting: Cardiology

## 2020-05-22 VITALS — BP 118/60 | HR 83 | Ht 73.0 in | Wt 172.8 lb

## 2020-05-22 DIAGNOSIS — R55 Syncope and collapse: Secondary | ICD-10-CM

## 2020-05-22 NOTE — Patient Instructions (Addendum)
Medication Instructions:  Your physician recommends that you continue on your current medications as directed. Please refer to the Current Medication list given to you today.  Labwork: None ordered.  Testing/Procedures: None ordered.  Follow-Up: Your physician wants you to follow-up in: as needed with Dr. Lambert.    Any Other Special Instructions Will Be Listed Below (If Applicable).  If you need a refill on your cardiac medications before your next appointment, please call your pharmacy.   
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
## 2020-05-29 ENCOUNTER — Ambulatory Visit: Payer: Managed Care, Other (non HMO) | Admitting: Cardiology
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
